# Patient Record
Sex: Female | Born: 1935 | Hispanic: No | Marital: Married | Smoking: Never smoker
Health system: Southern US, Community
[De-identification: ages and names within clinical notes are randomized; demographics above are authoritative.]

## PROBLEM LIST (undated history)

## (undated) DIAGNOSIS — M1990 Unspecified osteoarthritis, unspecified site: Secondary | ICD-10-CM

## (undated) DIAGNOSIS — M810 Age-related osteoporosis without current pathological fracture: Secondary | ICD-10-CM

## (undated) DIAGNOSIS — I10 Essential (primary) hypertension: Secondary | ICD-10-CM

## (undated) DIAGNOSIS — G309 Alzheimer's disease, unspecified: Secondary | ICD-10-CM

## (undated) DIAGNOSIS — C801 Malignant (primary) neoplasm, unspecified: Secondary | ICD-10-CM

## (undated) DIAGNOSIS — D649 Anemia, unspecified: Secondary | ICD-10-CM

## (undated) DIAGNOSIS — F0280 Dementia in other diseases classified elsewhere without behavioral disturbance: Secondary | ICD-10-CM

## (undated) HISTORY — DX: Malignant (primary) neoplasm, unspecified: C80.1

## (undated) HISTORY — DX: Dementia in other diseases classified elsewhere, unspecified severity, without behavioral disturbance, psychotic disturbance, mood disturbance, and anxiety: F02.80

## (undated) HISTORY — PX: PARTIAL HYSTERECTOMY: SHX80

## (undated) HISTORY — DX: Alzheimer's disease, unspecified: G30.9

## (undated) HISTORY — DX: Unspecified osteoarthritis, unspecified site: M19.90

## (undated) HISTORY — PX: DILATION AND CURETTAGE OF UTERUS: SHX78

## (undated) HISTORY — DX: Essential (primary) hypertension: I10

## (undated) HISTORY — PX: BREAST SURGERY: SHX581

## (undated) HISTORY — DX: Age-related osteoporosis without current pathological fracture: M81.0

## (undated) HISTORY — DX: Anemia, unspecified: D64.9

---

## 2004-09-30 ENCOUNTER — Ambulatory Visit: Payer: Self-pay | Admitting: Unknown Physician Specialty

## 2004-12-18 ENCOUNTER — Ambulatory Visit: Payer: Self-pay | Admitting: Urology

## 2005-10-20 ENCOUNTER — Ambulatory Visit: Payer: Self-pay | Admitting: Unknown Physician Specialty

## 2006-10-24 ENCOUNTER — Ambulatory Visit: Payer: Self-pay | Admitting: Unknown Physician Specialty

## 2007-05-09 ENCOUNTER — Ambulatory Visit: Payer: Self-pay | Admitting: Family Medicine

## 2007-10-25 ENCOUNTER — Encounter: Payer: Self-pay | Admitting: Unknown Physician Specialty

## 2007-11-13 ENCOUNTER — Ambulatory Visit: Payer: Self-pay | Admitting: Unknown Physician Specialty

## 2007-11-19 ENCOUNTER — Encounter: Payer: Self-pay | Admitting: Unknown Physician Specialty

## 2008-11-15 ENCOUNTER — Ambulatory Visit: Payer: Self-pay | Admitting: Unknown Physician Specialty

## 2009-11-27 ENCOUNTER — Ambulatory Visit: Payer: Self-pay | Admitting: Unknown Physician Specialty

## 2010-07-21 ENCOUNTER — Ambulatory Visit: Payer: Self-pay | Admitting: Ophthalmology

## 2010-08-05 ENCOUNTER — Ambulatory Visit: Payer: Self-pay | Admitting: Ophthalmology

## 2010-09-08 ENCOUNTER — Ambulatory Visit: Payer: Self-pay | Admitting: Ophthalmology

## 2010-12-08 ENCOUNTER — Ambulatory Visit: Payer: Self-pay | Admitting: Family Medicine

## 2011-02-11 ENCOUNTER — Emergency Department: Payer: Self-pay | Admitting: Emergency Medicine

## 2012-02-24 ENCOUNTER — Emergency Department: Payer: Self-pay | Admitting: Emergency Medicine

## 2012-02-24 LAB — CBC
HCT: 40.1 % (ref 35.0–47.0)
MCHC: 33.8 g/dL (ref 32.0–36.0)
MCV: 94 fL (ref 80–100)
Platelet: 165 10*3/uL (ref 150–440)
RBC: 4.26 10*6/uL (ref 3.80–5.20)
RDW: 13.3 % (ref 11.5–14.5)

## 2012-02-24 LAB — COMPREHENSIVE METABOLIC PANEL
Alkaline Phosphatase: 58 U/L (ref 50–136)
Bilirubin,Total: 0.5 mg/dL (ref 0.2–1.0)
Calcium, Total: 8.4 mg/dL — ABNORMAL LOW (ref 8.5–10.1)
EGFR (Non-African Amer.): 45 — ABNORMAL LOW
Osmolality: 294 (ref 275–301)
Potassium: 3.2 mmol/L — ABNORMAL LOW (ref 3.5–5.1)
SGOT(AST): 33 U/L (ref 15–37)
SGPT (ALT): 45 U/L
Total Protein: 6.7 g/dL (ref 6.4–8.2)

## 2012-02-24 LAB — DRUG SCREEN, URINE
Amphetamines, Ur Screen: NEGATIVE (ref ?–1000)
Barbiturates, Ur Screen: NEGATIVE (ref ?–200)
MDMA (Ecstasy)Ur Screen: POSITIVE (ref ?–500)
Opiate, Ur Screen: NEGATIVE (ref ?–300)
Phencyclidine (PCP) Ur S: NEGATIVE (ref ?–25)
Tricyclic, Ur Screen: NEGATIVE (ref ?–1000)

## 2012-02-24 LAB — TROPONIN I: Troponin-I: <0.02 ng/mL

## 2012-02-24 LAB — URINALYSIS, COMPLETE
Bacteria: NONE SEEN
Bilirubin,UR: NEGATIVE
Leukocyte Esterase: NEGATIVE
Protein: NEGATIVE
RBC,UR: 1 /HPF (ref 0–5)

## 2012-02-24 LAB — CK TOTAL AND CKMB (NOT AT ARMC): CK, Total: 41 U/L (ref 21–215)

## 2012-02-24 LAB — AMMONIA: Ammonia, Plasma: 35 mcmol/L — ABNORMAL HIGH (ref 11–32)

## 2012-04-07 ENCOUNTER — Ambulatory Visit: Payer: Self-pay | Admitting: Family Medicine

## 2012-04-27 IMAGING — CR DG CHEST 2V
1 series · 2 of 2 positions shown · non-contrast
Comparison: none

REASON FOR EXAM: trauma, pain on R
COMMENTS:

[Series 1: view not recorded · 0.17mm/px · 2 of 2 slices shown]
[im 1/2]
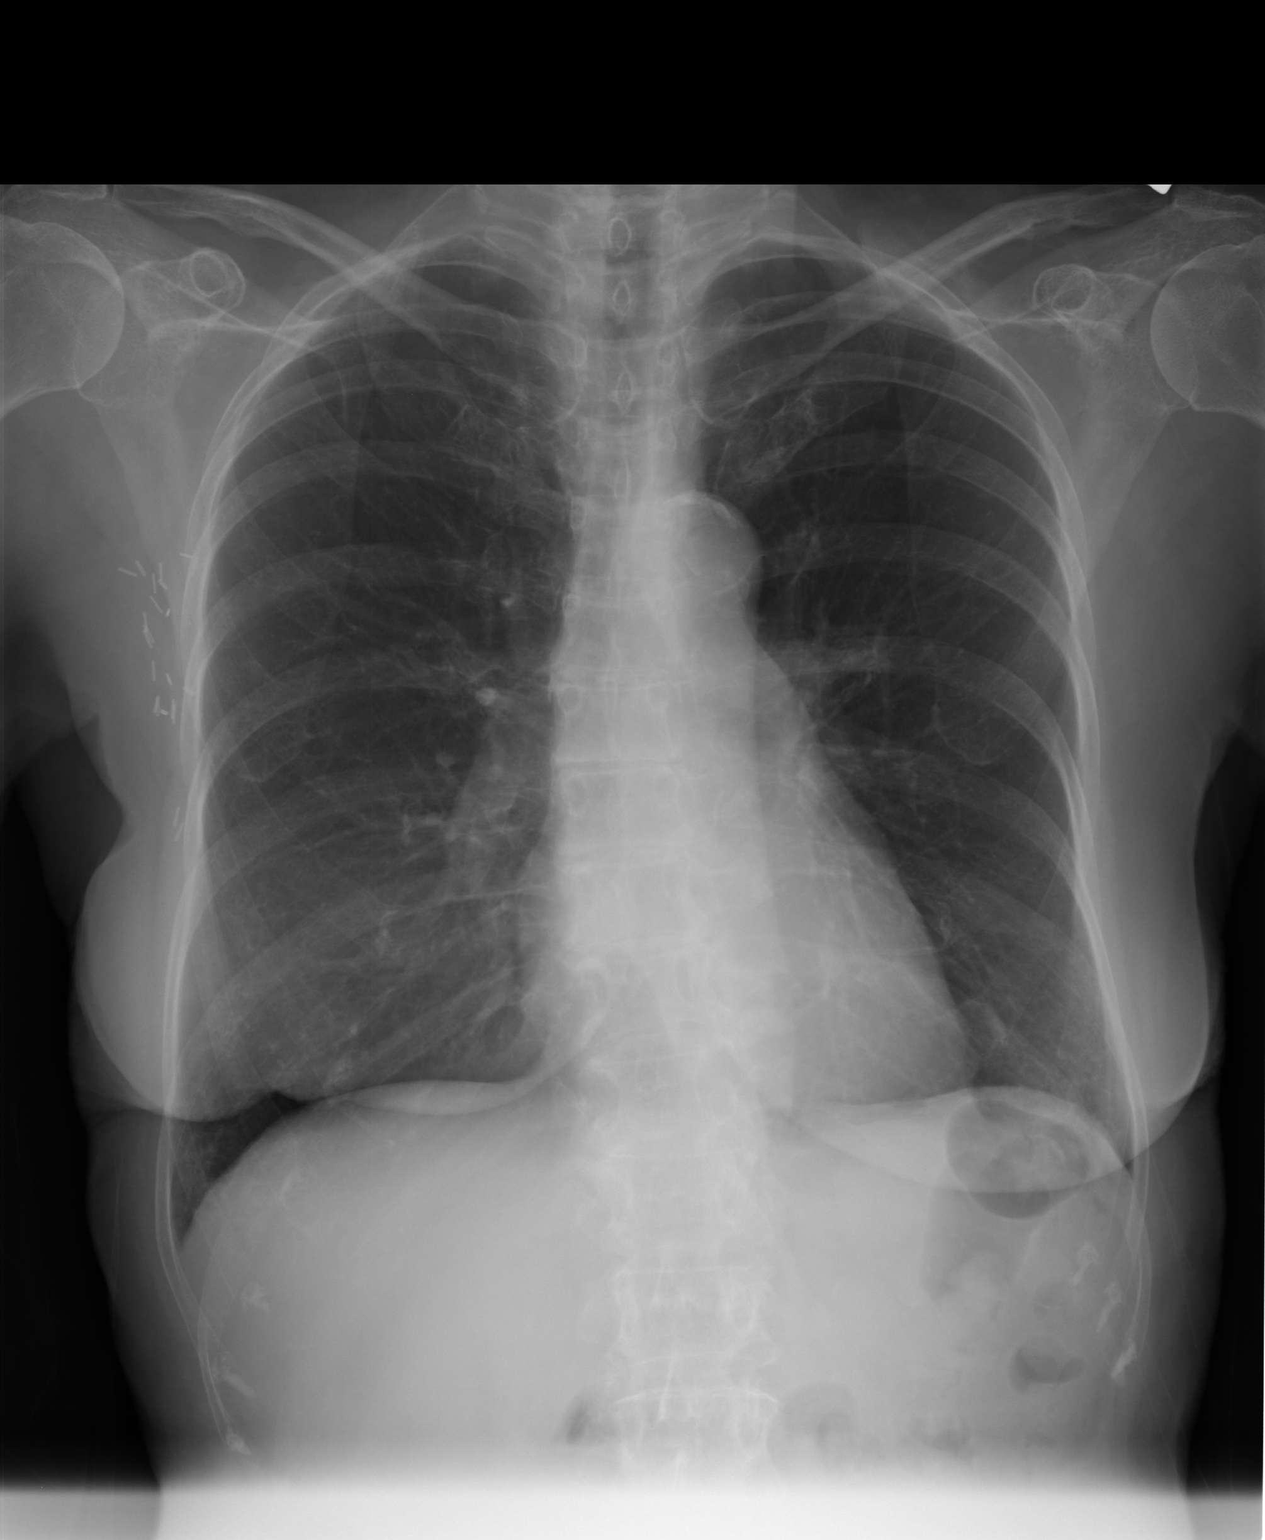
[im 2/2]
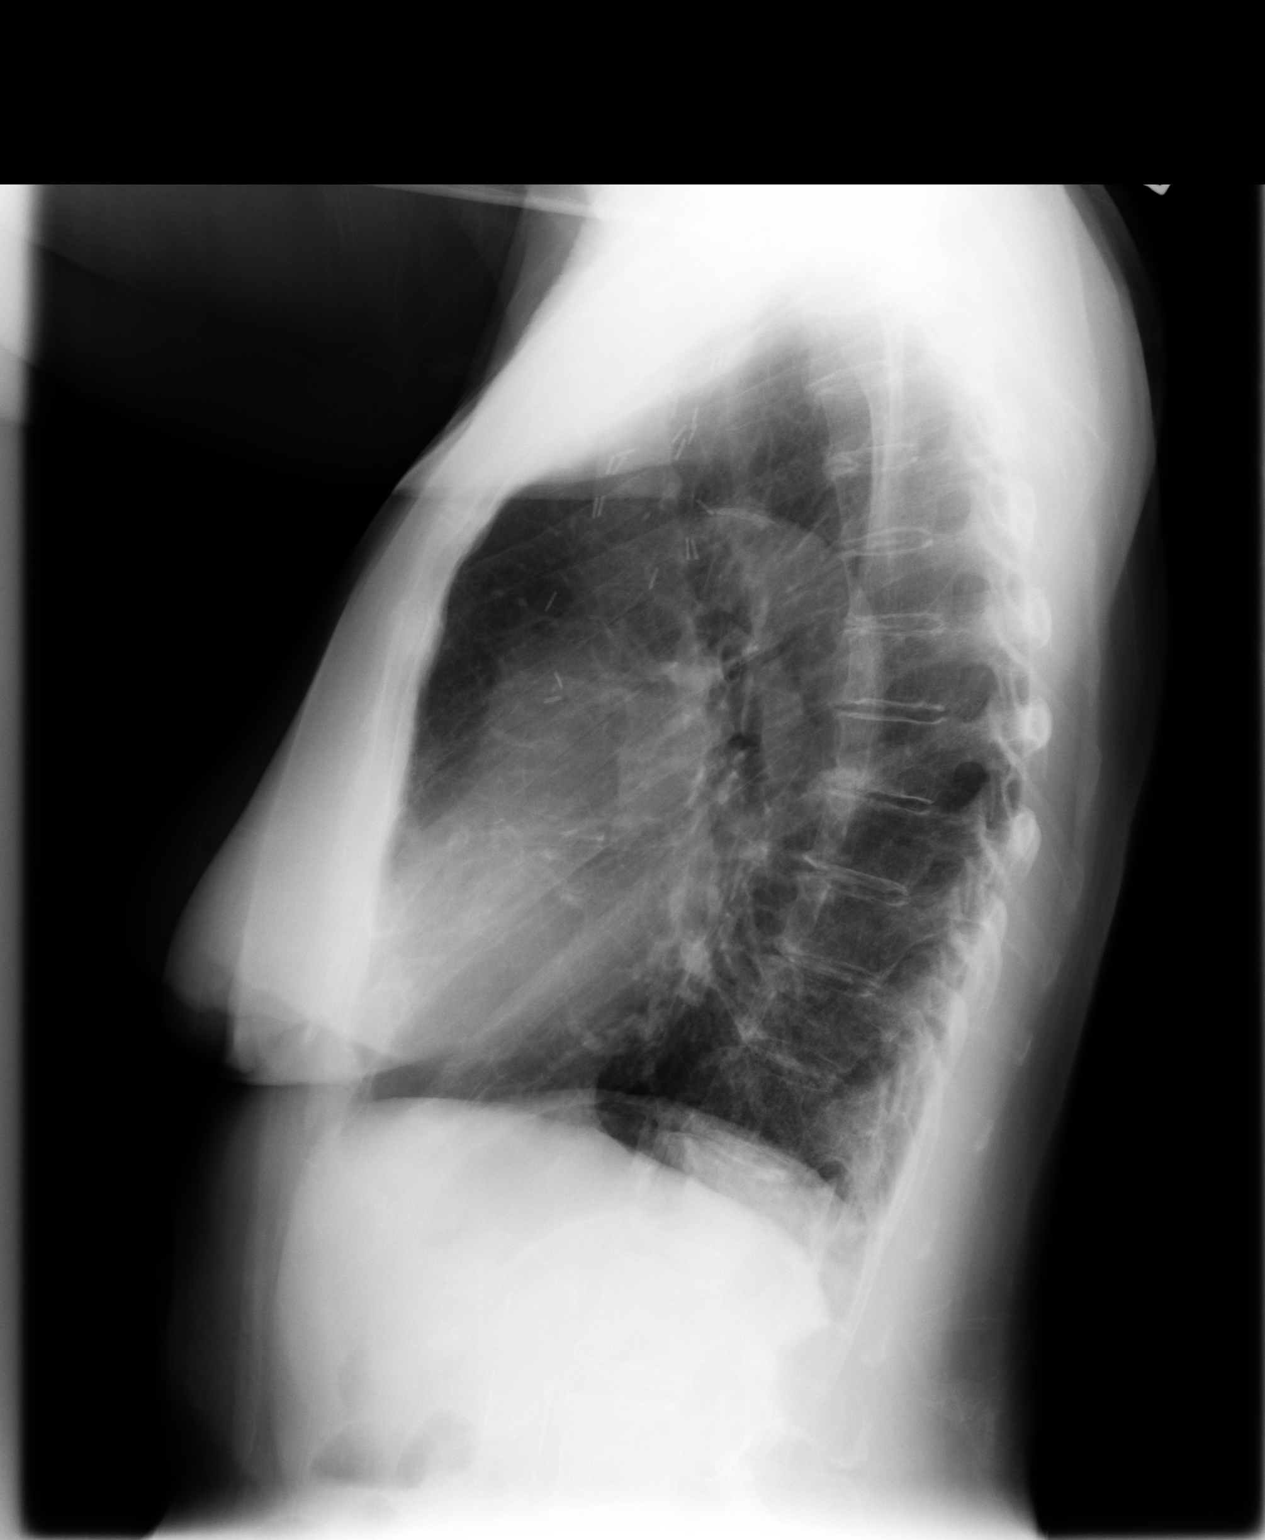

[2 of 2 positions shown; findings below may reference images not displayed]

PROCEDURE:     DXR - DXR CHEST PA (OR AP) AND LATERAL  - February 11, 2011  [DATE]

RESULT:     There is no previous exam for comparison.

Surgical clips are seen in the right axilla. The cardiac silhouette is
normal. Atherosclerotic calcification is seen in the aorta. The lungs show
emphysematous changes. There is no evidence of mass, edema, infiltrate,
effusion or pneumothorax.
IMPRESSION: 1. COPD.
2. Postoperative changes with surgical clips in the right axillary region.
3. Atherosclerotic changes present.

## 2013-05-10 IMAGING — CR DG CHEST 1V PORT
1 series · 1 of 1 positions shown · non-contrast
Comparison: none

REASON FOR EXAM: aloc
COMMENTS:

[portable]
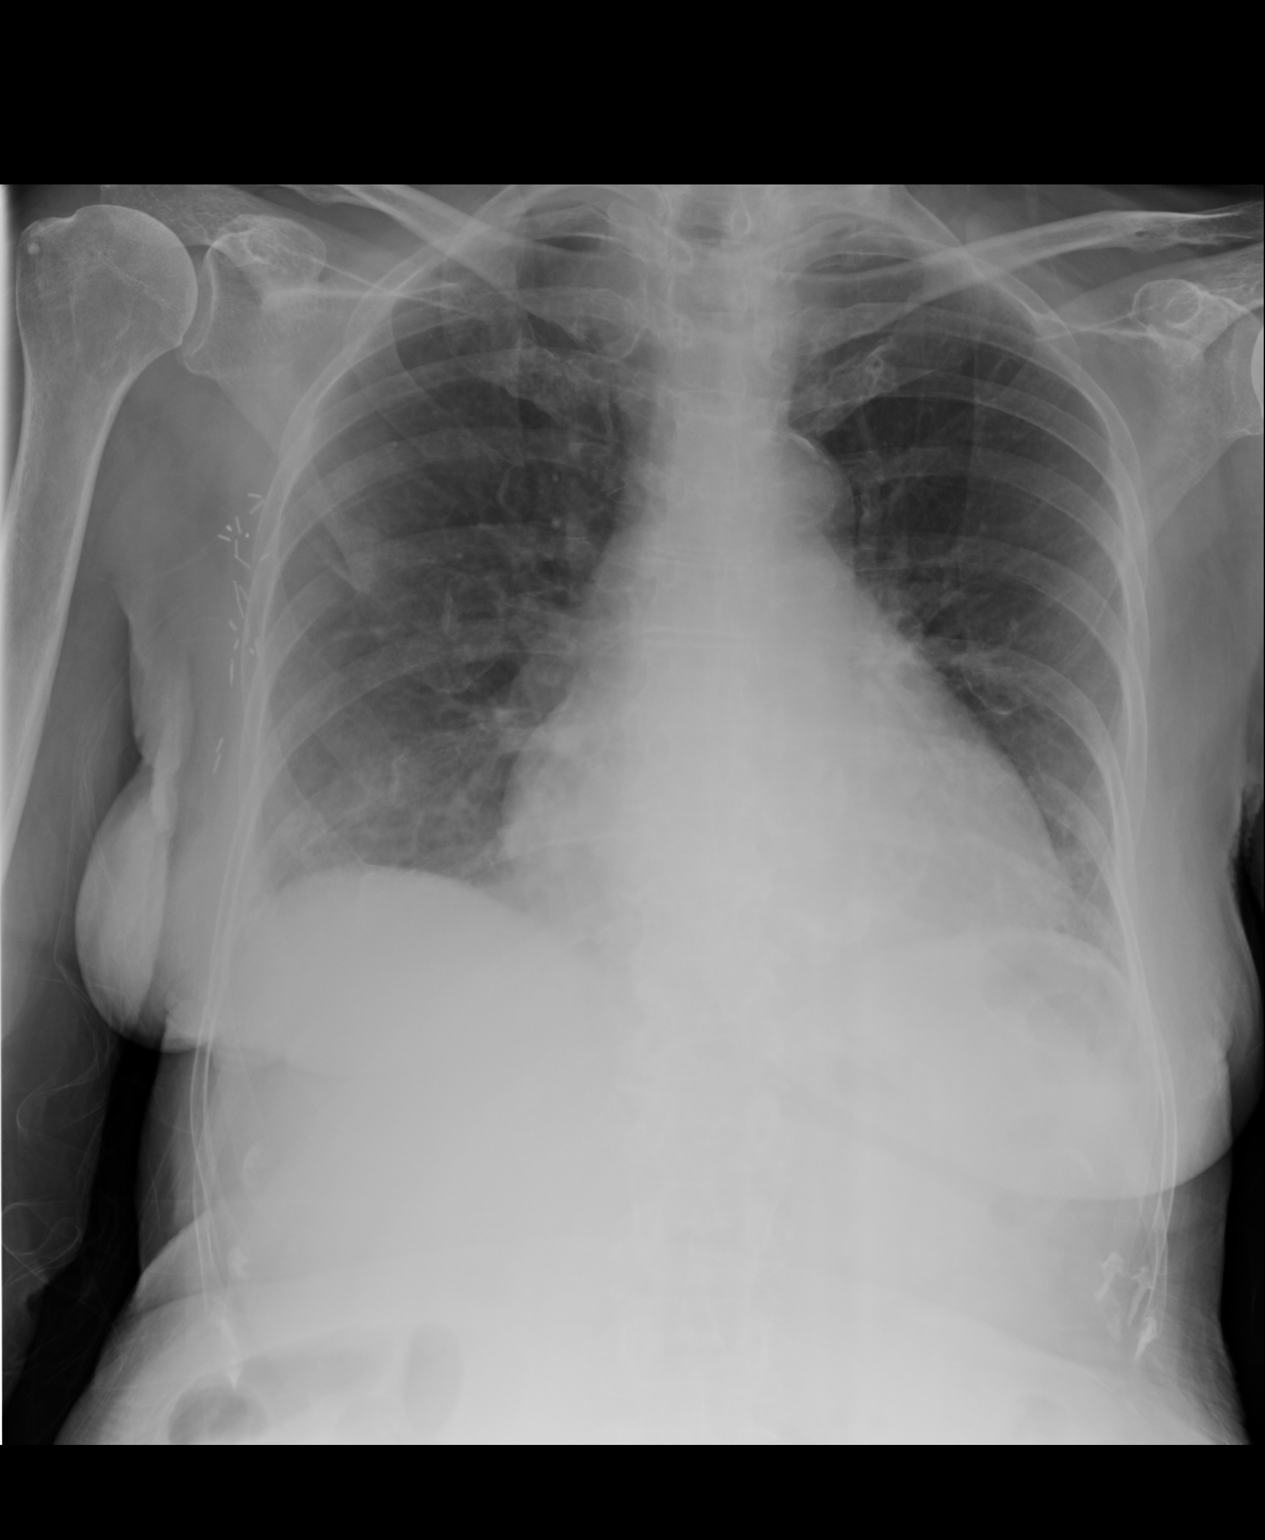

[1 of 1 positions shown; findings below may reference images not displayed]

PROCEDURE:     DXR - DXR PORTABLE CHEST SINGLE VIEW  - February 24, 2012  [DATE]

RESULT:     Frontal view of the chest is performed. Comparison is made to a
prior study dated 02/11/2012.

The patient has taken a shallow inspiration. With technique taken into
consideration, there is no evidence of focal infiltrates, effusions or
edema. This is a portable chest radiograph which also degrades evaluation.
IMPRESSION: No evidence of acute cardiopulmonary disease with technique
taken into consideration.

## 2013-06-22 IMAGING — US US EXTREM LOW VENOUS*R*
1 series · 14 of 24 positions shown · non-contrast
Comparison: none

REASON FOR EXAM: rt lower extr swelling calf pain  eval DVT    CR  [phone]
[phone]  and  [phone]
COMMENTS:

[Series 1: us extrem low venous*right* · 0.08mm/px · 14 of 39 slices shown]
[im 1/39]
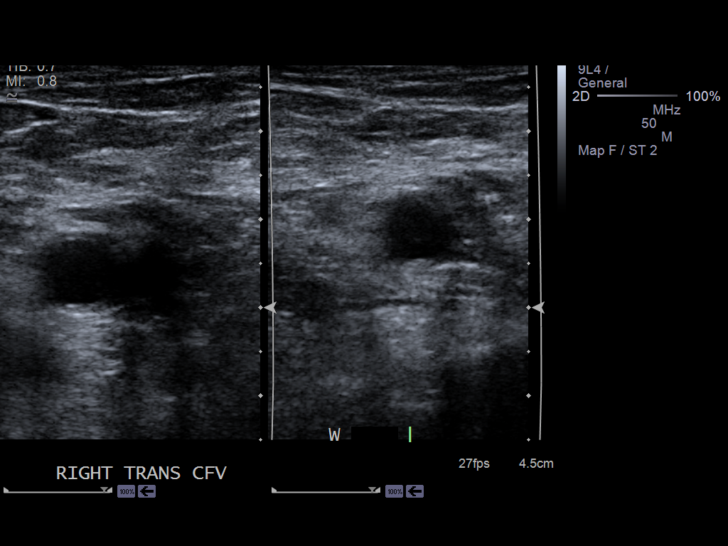
[im 4/39]
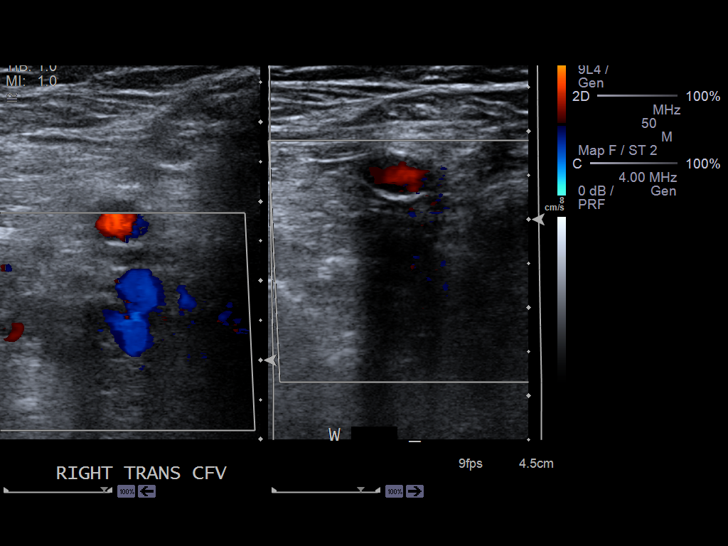
[im 7/39]
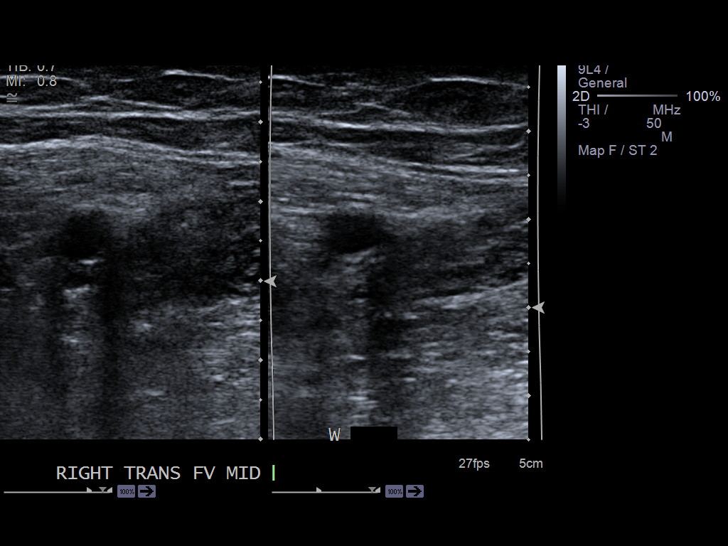
[im 10/39]
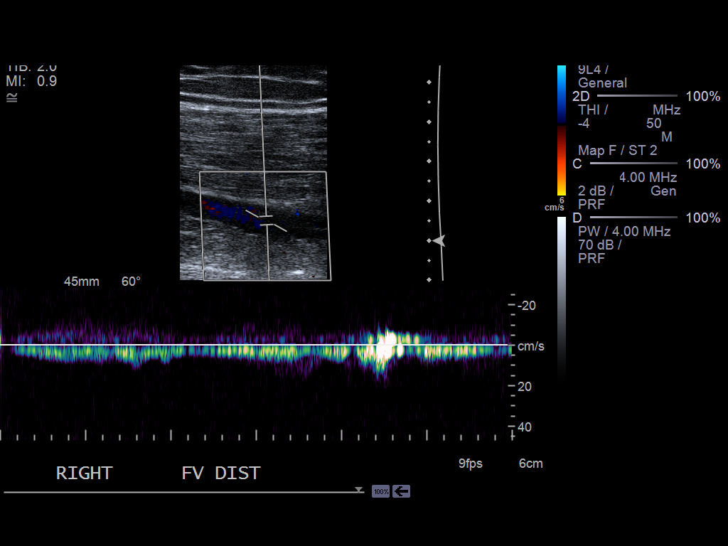
[im 12/39]
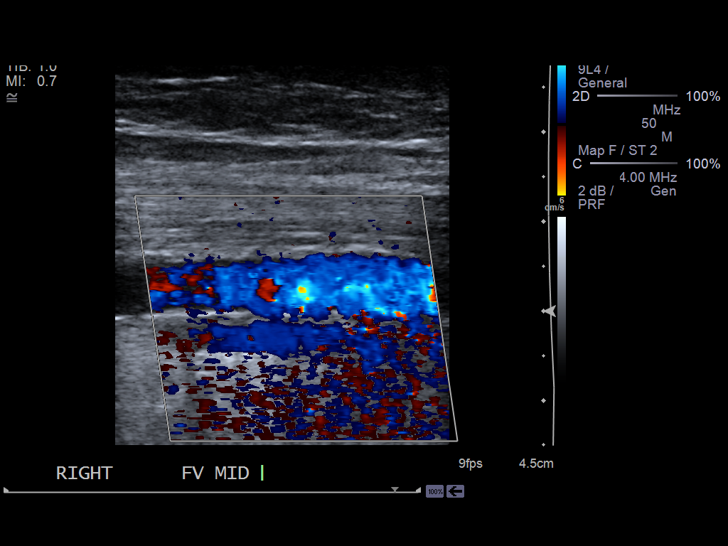
[im 15/39]
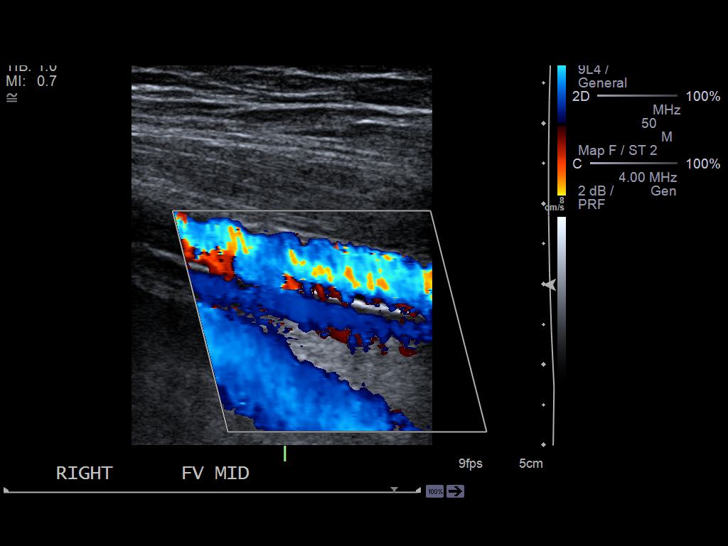
[im 19/39]
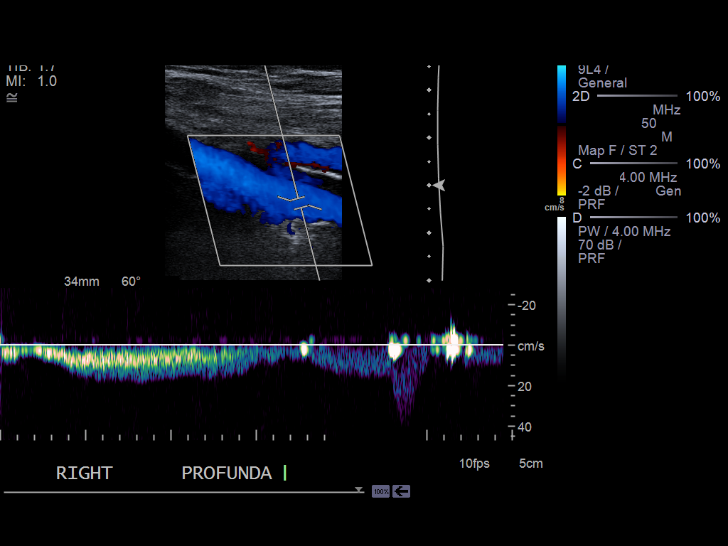
[im 20/39]
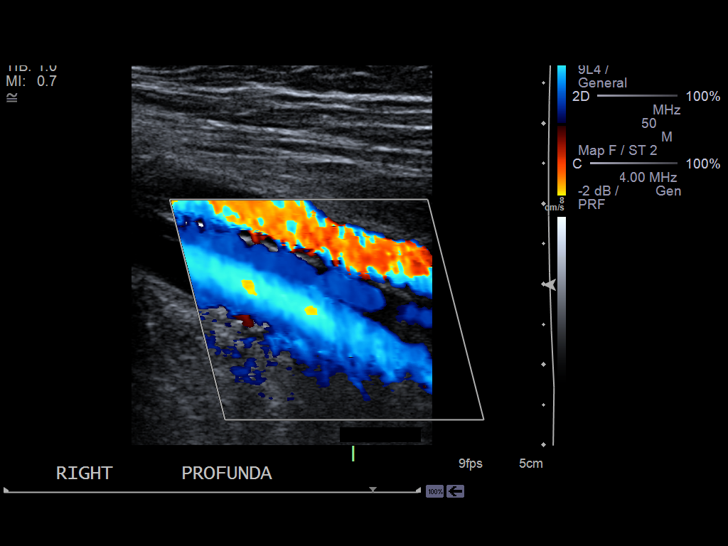
[im 24/39]
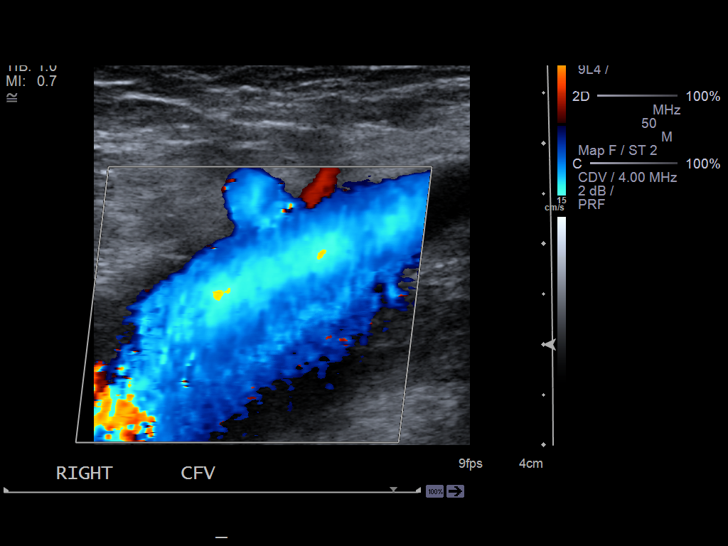
[im 27/39]
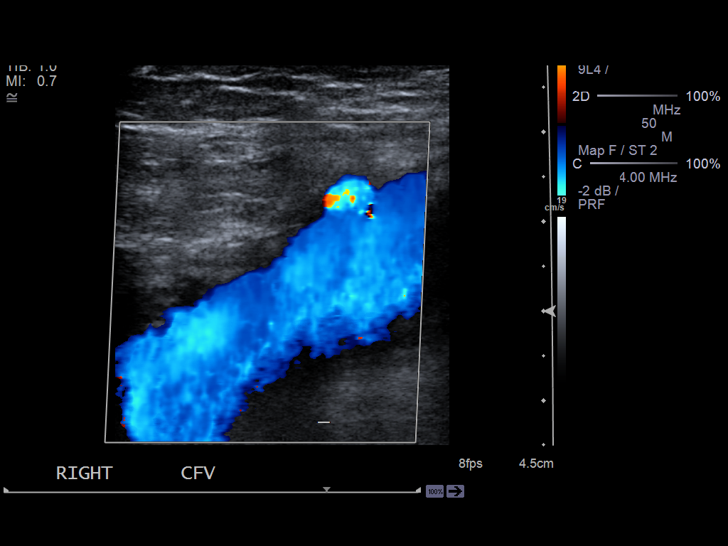
[im 30/39]
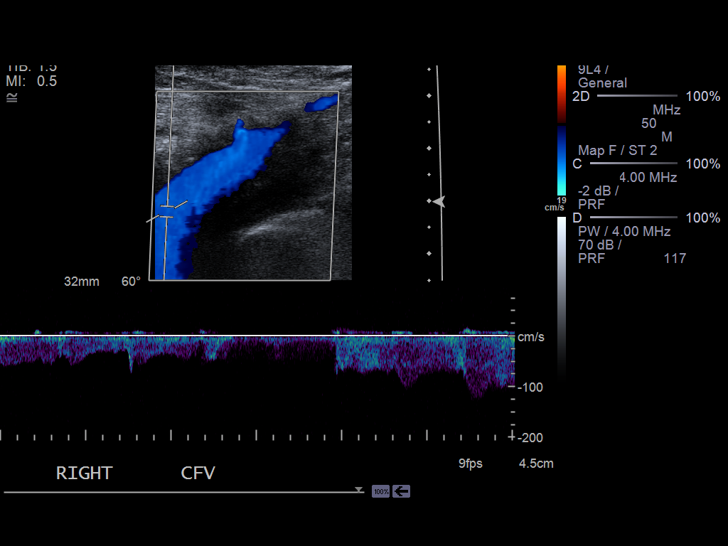
[im 32/39]
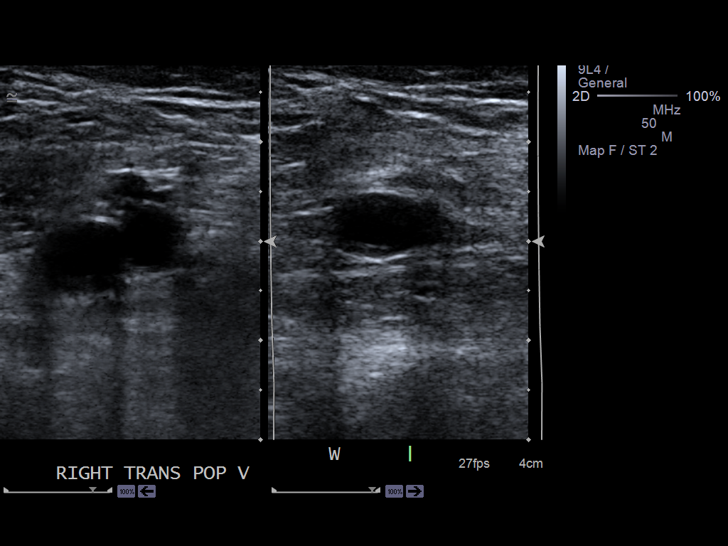
[im 35/39]
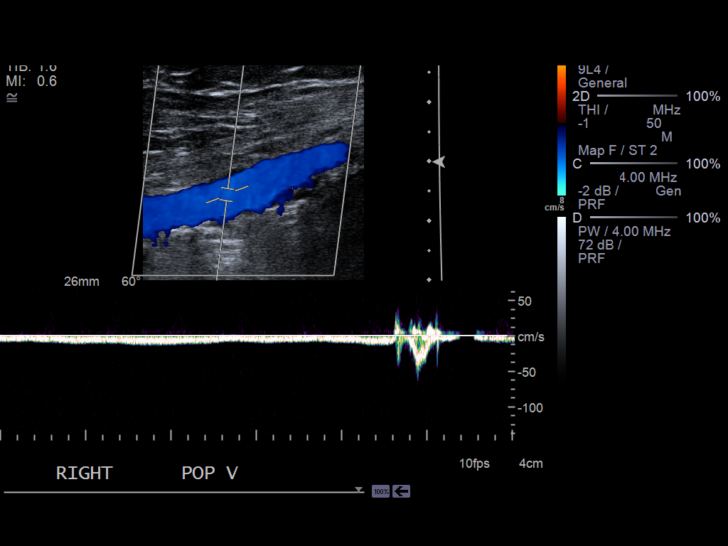
[im 39/39]
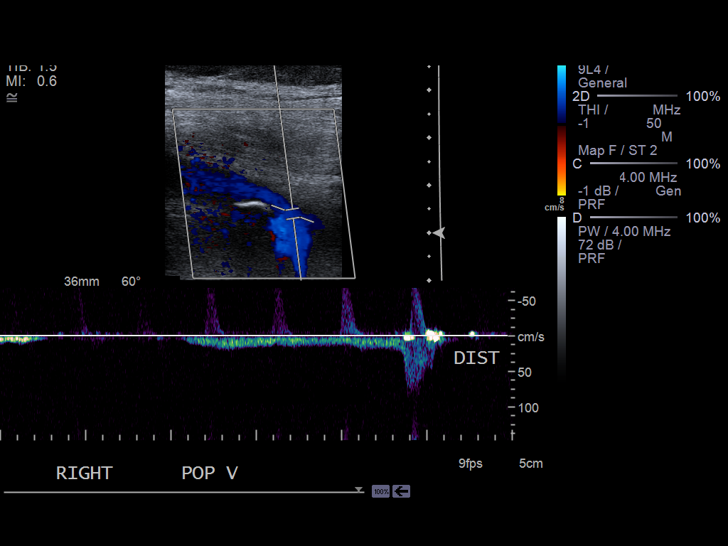

[14 of 24 positions shown; findings below may reference images not displayed]

PROCEDURE:     US  - US DOPPLER LOW EXTR RIGHT  - April 07, 2012  [DATE]

RESULT:     The phasic, augmentation and Valsalva flow waveforms are normal
in appearance. The right femoral and popliteal vein shows complete
compressibility throughout its course. Doppler examination shows no
occlusion or evidence for deep vein thrombosis.
IMPRESSION: 1. No deep vein thrombosis is identified in the right leg.

[REDACTED]

## 2013-08-10 ENCOUNTER — Ambulatory Visit: Payer: Self-pay | Admitting: Podiatry

## 2013-08-21 ENCOUNTER — Encounter: Payer: Self-pay | Admitting: Podiatry

## 2013-08-21 ENCOUNTER — Ambulatory Visit (INDEPENDENT_AMBULATORY_CARE_PROVIDER_SITE_OTHER): Payer: Medicare Other | Admitting: Podiatry

## 2013-08-21 VITALS — BP 129/76 | HR 70 | Resp 20 | Ht 65.0 in | Wt 114.0 lb

## 2013-08-21 DIAGNOSIS — B351 Tinea unguium: Secondary | ICD-10-CM

## 2013-08-21 DIAGNOSIS — M79609 Pain in unspecified limb: Secondary | ICD-10-CM

## 2013-08-21 NOTE — Progress Notes (Signed)
Subjective:     Patient ID: Kelli Little, female   DOB: 1921/05/30, 79 y.o.   MRN: [id number]  HPI nail disease with pain 1-5 both feet   Review of Systems     Objective:   Physical Exam  Nursing note and vitals reviewed. Constitutional: She is oriented to person, place, and time.  Neurological: She is oriented to person, place, and time.  Skin: Skin is dry.   Thick painful nailbeds 1-5 both feet when pressed    Assessment:     Mycotic nail infection 1-5 both feet with pain    Plan:     Debrided nails 1-5 both feet with no iatrogenic bleeding noted

## 2013-11-20 ENCOUNTER — Ambulatory Visit (INDEPENDENT_AMBULATORY_CARE_PROVIDER_SITE_OTHER): Payer: Medicare Other | Admitting: Podiatry

## 2013-11-20 ENCOUNTER — Encounter: Payer: Self-pay | Admitting: Podiatry

## 2013-11-20 VITALS — BP 134/79 | HR 66 | Resp 16 | Ht 62.0 in

## 2013-11-20 DIAGNOSIS — B351 Tinea unguium: Secondary | ICD-10-CM

## 2013-11-20 DIAGNOSIS — M79609 Pain in unspecified limb: Secondary | ICD-10-CM

## 2013-11-20 NOTE — Progress Notes (Signed)
Subjective:     Patient ID: Kelli Little, female   DOB: 20-Jan-1921, 80 y.o.   MRN: [id number]  HPI patient is found to have painful nailbeds are thick and painful 1-5 both feet   Review of Systems     Objective:   Physical Exam Neurovascular status unchanged with thick damage nailbeds 1-5 both feet    Assessment:     Chronic mycotic nail infection with pain 1-5 both feet    Plan:     Debridement painful nail bed 1-5 both feet with no iatrogenic bleeding noted

## 2014-02-19 ENCOUNTER — Ambulatory Visit (INDEPENDENT_AMBULATORY_CARE_PROVIDER_SITE_OTHER): Payer: Medicare Other | Admitting: Podiatry

## 2014-02-19 VITALS — BP 112/66 | HR 69 | Resp 16

## 2014-02-19 DIAGNOSIS — M999 Biomechanical lesion, unspecified: Secondary | ICD-10-CM

## 2014-02-19 DIAGNOSIS — M79609 Pain in unspecified limb: Secondary | ICD-10-CM

## 2014-02-19 DIAGNOSIS — B351 Tinea unguium: Secondary | ICD-10-CM

## 2014-02-19 NOTE — Progress Notes (Signed)
Subjective:     Patient ID: Kelli Little, female   DOB: 1921-02-21, 80 y.o.   MRN: [id number]  HPI patient presents with thick nailbeds 1-5 both feet that are painful and she cannot cut   Review of Systems     Objective:   Physical Exam Neurovascular status unchanged with thick painful nailbeds 1-5 both feet that are impossible for her to cut and brittle    Assessment:     Mycotic nail infection with pain 1-5 both feet    Plan:     Debridement of painful nailbeds 1-5 both feet with no iatrogenic bleeding noted

## 2014-03-22 DIAGNOSIS — M159 Polyosteoarthritis, unspecified: Secondary | ICD-10-CM

## 2014-03-22 DIAGNOSIS — IMO0002 Reserved for concepts with insufficient information to code with codable children: Secondary | ICD-10-CM

## 2014-03-22 DIAGNOSIS — G309 Alzheimer's disease, unspecified: Secondary | ICD-10-CM

## 2014-03-22 DIAGNOSIS — R634 Abnormal weight loss: Secondary | ICD-10-CM

## 2014-03-22 DIAGNOSIS — F0280 Dementia in other diseases classified elsewhere without behavioral disturbance: Secondary | ICD-10-CM

## 2014-03-22 DIAGNOSIS — I10 Essential (primary) hypertension: Secondary | ICD-10-CM

## 2014-05-06 DIAGNOSIS — M159 Polyosteoarthritis, unspecified: Secondary | ICD-10-CM

## 2014-05-06 DIAGNOSIS — R634 Abnormal weight loss: Secondary | ICD-10-CM

## 2014-05-06 DIAGNOSIS — IMO0002 Reserved for concepts with insufficient information to code with codable children: Secondary | ICD-10-CM

## 2014-05-06 DIAGNOSIS — F0280 Dementia in other diseases classified elsewhere without behavioral disturbance: Secondary | ICD-10-CM

## 2014-05-06 DIAGNOSIS — G309 Alzheimer's disease, unspecified: Secondary | ICD-10-CM

## 2014-06-05 ENCOUNTER — Telehealth: Payer: Self-pay

## 2014-06-05 DIAGNOSIS — IMO0002 Reserved for concepts with insufficient information to code with codable children: Secondary | ICD-10-CM

## 2014-06-05 DIAGNOSIS — F0280 Dementia in other diseases classified elsewhere without behavioral disturbance: Secondary | ICD-10-CM

## 2014-06-05 DIAGNOSIS — R634 Abnormal weight loss: Secondary | ICD-10-CM

## 2014-06-05 DIAGNOSIS — F430 Acute stress reaction: Secondary | ICD-10-CM

## 2014-06-05 DIAGNOSIS — M1710 Unilateral primary osteoarthritis, unspecified knee: Secondary | ICD-10-CM

## 2014-06-05 DIAGNOSIS — G309 Alzheimer's disease, unspecified: Secondary | ICD-10-CM

## 2014-06-05 NOTE — Telephone Encounter (Signed)
Discussed that her decline seems to be due to her disease Will go ahead with the hospice consult we had discussed before

## 2014-06-05 NOTE — Telephone Encounter (Signed)
Kelli Little pts son left v/m requesting cb at [phone] about pts present situation; pt is at [facility]. Mr Tamala Julian said has heard from nurses that pt is staying in bed all day and eating nothing; [name] said that is a decline in last few weeks; wants Dr Alla German opinion of pts condition and is there anything else to be done.

## 2014-06-11 ENCOUNTER — Ambulatory Visit: Payer: Medicare Other | Admitting: Podiatry

## 2014-06-17 DIAGNOSIS — F0280 Dementia in other diseases classified elsewhere without behavioral disturbance: Secondary | ICD-10-CM

## 2014-06-17 DIAGNOSIS — F02818 Dementia in other diseases classified elsewhere, unspecified severity, with other behavioral disturbance: Secondary | ICD-10-CM

## 2014-06-17 DIAGNOSIS — R634 Abnormal weight loss: Secondary | ICD-10-CM

## 2014-06-17 DIAGNOSIS — R627 Adult failure to thrive: Secondary | ICD-10-CM

## 2014-06-17 DIAGNOSIS — F0281 Dementia in other diseases classified elsewhere with behavioral disturbance: Secondary | ICD-10-CM

## 2014-06-17 DIAGNOSIS — G309 Alzheimer's disease, unspecified: Secondary | ICD-10-CM

## 2014-07-08 DIAGNOSIS — G309 Alzheimer's disease, unspecified: Secondary | ICD-10-CM

## 2014-07-08 DIAGNOSIS — E43 Unspecified severe protein-calorie malnutrition: Secondary | ICD-10-CM

## 2014-07-08 DIAGNOSIS — F0280 Dementia in other diseases classified elsewhere without behavioral disturbance: Secondary | ICD-10-CM

## 2014-07-08 DIAGNOSIS — IMO0002 Reserved for concepts with insufficient information to code with codable children: Secondary | ICD-10-CM

## 2014-07-08 DIAGNOSIS — M159 Polyosteoarthritis, unspecified: Secondary | ICD-10-CM

## 2014-08-23 DIAGNOSIS — M1990 Unspecified osteoarthritis, unspecified site: Secondary | ICD-10-CM

## 2014-08-23 DIAGNOSIS — E43 Unspecified severe protein-calorie malnutrition: Secondary | ICD-10-CM

## 2014-08-23 DIAGNOSIS — R451 Restlessness and agitation: Secondary | ICD-10-CM

## 2014-08-23 DIAGNOSIS — G309 Alzheimer's disease, unspecified: Secondary | ICD-10-CM

## 2014-08-27 ENCOUNTER — Ambulatory Visit: Payer: Self-pay | Admitting: Internal Medicine

## 2014-09-04 ENCOUNTER — Telehealth: Payer: Self-pay | Admitting: Family Medicine

## 2014-09-04 ENCOUNTER — Telehealth: Payer: Self-pay | Admitting: Internal Medicine

## 2014-09-04 NOTE — Telephone Encounter (Signed)
Left message for patient's son they would need to call [facility] and leave a message for Dr.Letvak there.

## 2014-09-04 NOTE — Telephone Encounter (Signed)
Pt's son is calling and he would like a document typed up stating that the pt unable to make financial decisions or to sign legal documents.  Thank you!

## 2014-09-25 DIAGNOSIS — R627 Adult failure to thrive: Secondary | ICD-10-CM

## 2014-09-25 DIAGNOSIS — G309 Alzheimer's disease, unspecified: Secondary | ICD-10-CM

## 2014-09-25 DIAGNOSIS — F0281 Dementia in other diseases classified elsewhere with behavioral disturbance: Secondary | ICD-10-CM

## 2014-09-25 DIAGNOSIS — R634 Abnormal weight loss: Secondary | ICD-10-CM

## 2014-10-31 DIAGNOSIS — F0391 Unspecified dementia with behavioral disturbance: Secondary | ICD-10-CM

## 2014-10-31 DIAGNOSIS — M1991 Primary osteoarthritis, unspecified site: Secondary | ICD-10-CM | POA: Diagnosis not present

## 2014-10-31 DIAGNOSIS — E43 Unspecified severe protein-calorie malnutrition: Secondary | ICD-10-CM | POA: Diagnosis not present

## 2014-10-31 DIAGNOSIS — R451 Restlessness and agitation: Secondary | ICD-10-CM | POA: Diagnosis not present

## 2014-12-10 DIAGNOSIS — R634 Abnormal weight loss: Secondary | ICD-10-CM

## 2014-12-10 DIAGNOSIS — G309 Alzheimer's disease, unspecified: Secondary | ICD-10-CM | POA: Diagnosis not present

## 2014-12-10 DIAGNOSIS — R627 Adult failure to thrive: Secondary | ICD-10-CM

## 2014-12-10 DIAGNOSIS — F0281 Dementia in other diseases classified elsewhere with behavioral disturbance: Secondary | ICD-10-CM | POA: Diagnosis not present

## 2015-01-01 DIAGNOSIS — E43 Unspecified severe protein-calorie malnutrition: Secondary | ICD-10-CM

## 2015-01-01 DIAGNOSIS — R451 Restlessness and agitation: Secondary | ICD-10-CM

## 2015-01-01 DIAGNOSIS — G309 Alzheimer's disease, unspecified: Secondary | ICD-10-CM

## 2015-01-01 DIAGNOSIS — M1990 Unspecified osteoarthritis, unspecified site: Secondary | ICD-10-CM | POA: Diagnosis not present
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
## 2015-02-09 NOTE — Consult Note (Signed)
Brief Consult Note: Diagnosis: dementia with behavioral disturbance.   Patient was seen by consultant.   Consult note dictated.   Recommend further assessment or treatment.   Comments: Psychiatry:Patient seen. Chart and accompning papers reviewed. Patient with history of dementia presumed Alzheimer's type has been showing problems with aggression and food refusal for months. Current behaviors and persistance of food refusal have become worrisome enough they sent her to ER. Workup so far not showing obvious cause of increased confusin. Patient denies being in pain or discomfort. I agree with current plan for transfer to gero-psych ward for specialty care beyond what is avilible at [facility].  Electronic Signatures: Gonzella Lex (MD)  (Signed 10-May-13 14:59)  Authored: Brief Consult Note   Last Updated: 10-May-13 14:59 by Gonzella Lex (MD)

## 2015-02-10 DIAGNOSIS — F0281 Dementia in other diseases classified elsewhere with behavioral disturbance: Secondary | ICD-10-CM

## 2015-02-10 DIAGNOSIS — R634 Abnormal weight loss: Secondary | ICD-10-CM

## 2015-02-10 DIAGNOSIS — G309 Alzheimer's disease, unspecified: Secondary | ICD-10-CM | POA: Diagnosis not present

## 2015-02-10 DIAGNOSIS — R627 Adult failure to thrive: Secondary | ICD-10-CM

## 2015-02-27 DIAGNOSIS — M150 Primary generalized (osteo)arthritis: Secondary | ICD-10-CM | POA: Diagnosis not present

## 2015-02-27 DIAGNOSIS — G301 Alzheimer's disease with late onset: Secondary | ICD-10-CM | POA: Diagnosis not present

## 2015-02-27 DIAGNOSIS — E43 Unspecified severe protein-calorie malnutrition: Secondary | ICD-10-CM | POA: Diagnosis not present

## 2015-02-27 DIAGNOSIS — R451 Restlessness and agitation: Secondary | ICD-10-CM

## 2015-04-09 DIAGNOSIS — R627 Adult failure to thrive: Secondary | ICD-10-CM

## 2015-04-09 DIAGNOSIS — R634 Abnormal weight loss: Secondary | ICD-10-CM | POA: Diagnosis not present

## 2015-04-09 DIAGNOSIS — G309 Alzheimer's disease, unspecified: Secondary | ICD-10-CM | POA: Diagnosis not present

## 2015-04-09 DIAGNOSIS — F0281 Dementia in other diseases classified elsewhere with behavioral disturbance: Secondary | ICD-10-CM | POA: Diagnosis not present

## 2015-04-30 DIAGNOSIS — G309 Alzheimer's disease, unspecified: Secondary | ICD-10-CM | POA: Diagnosis not present

## 2015-04-30 DIAGNOSIS — R451 Restlessness and agitation: Secondary | ICD-10-CM | POA: Diagnosis not present

## 2015-04-30 DIAGNOSIS — E43 Unspecified severe protein-calorie malnutrition: Secondary | ICD-10-CM | POA: Diagnosis not present

## 2015-04-30 DIAGNOSIS — M1990 Unspecified osteoarthritis, unspecified site: Secondary | ICD-10-CM | POA: Diagnosis not present

## 2015-05-29 DIAGNOSIS — S7292XA Unspecified fracture of left femur, initial encounter for closed fracture: Secondary | ICD-10-CM | POA: Diagnosis not present

## 2015-06-19 DIAGNOSIS — R627 Adult failure to thrive: Secondary | ICD-10-CM | POA: Diagnosis not present

## 2015-06-19 DIAGNOSIS — G309 Alzheimer's disease, unspecified: Secondary | ICD-10-CM | POA: Diagnosis not present

## 2015-06-19 DIAGNOSIS — F0281 Dementia in other diseases classified elsewhere with behavioral disturbance: Secondary | ICD-10-CM | POA: Diagnosis not present

## 2015-06-19 DIAGNOSIS — R634 Abnormal weight loss: Secondary | ICD-10-CM | POA: Diagnosis not present

## 2015-06-26 DIAGNOSIS — G309 Alzheimer's disease, unspecified: Secondary | ICD-10-CM | POA: Diagnosis not present

## 2015-06-26 DIAGNOSIS — E440 Moderate protein-calorie malnutrition: Secondary | ICD-10-CM | POA: Diagnosis not present

## 2015-06-26 DIAGNOSIS — R451 Restlessness and agitation: Secondary | ICD-10-CM | POA: Diagnosis not present

## 2015-06-26 DIAGNOSIS — S7292XA Unspecified fracture of left femur, initial encounter for closed fracture: Secondary | ICD-10-CM | POA: Diagnosis not present

## 2015-08-06 DIAGNOSIS — R451 Restlessness and agitation: Secondary | ICD-10-CM

## 2015-08-06 DIAGNOSIS — F0281 Dementia in other diseases classified elsewhere with behavioral disturbance: Secondary | ICD-10-CM

## 2015-08-06 DIAGNOSIS — E43 Unspecified severe protein-calorie malnutrition: Secondary | ICD-10-CM

## 2015-08-06 DIAGNOSIS — M1990 Unspecified osteoarthritis, unspecified site: Secondary | ICD-10-CM | POA: Diagnosis not present

## 2015-08-06 DIAGNOSIS — R634 Abnormal weight loss: Secondary | ICD-10-CM | POA: Diagnosis not present

## 2015-08-06 DIAGNOSIS — S72012A Unspecified intracapsular fracture of left femur, initial encounter for closed fracture: Secondary | ICD-10-CM

## 2015-08-06 DIAGNOSIS — G309 Alzheimer's disease, unspecified: Secondary | ICD-10-CM

## 2015-08-06 DIAGNOSIS — R627 Adult failure to thrive: Secondary | ICD-10-CM | POA: Diagnosis not present

## 2015-08-18 ENCOUNTER — Telehealth: Payer: Self-pay

## 2015-08-18 NOTE — Telephone Encounter (Signed)
Rx for roxanol done---signed today

## 2015-08-18 NOTE — Telephone Encounter (Signed)
PLEASE NOTE: All timestamps contained within this report are represented as [place] Standard Time. CONFIDENTIALTY NOTICE: This fax transmission is intended only for the addressee. It contains information that is legally privileged, confidential or otherwise protected from use or disclosure. If you are not the intended recipient, you are strictly prohibited from reviewing, disclosing, copying using or disseminating any of this information or taking any action in reliance on or regarding this information. If you have received this fax in error, please notify us immediately by telephone so that we can arrange for its return to [place]. Phone: [phone], Toll-Free: [phone], Fax: [phone] Page: 1 of 1 Call Id: [id number] Yaak Patient Name: Kelli Little Gender: Female DOB: 21-Sep-1921 Age: 81 Y 3 M 29 D Return Phone Number: Address: City/State/Zip: [place] Client [place] Night - Client Client Site [place] Physician Viviana Simpler Contact Type Call Call Type Page Only Caller Name Juliann Pulse Relationship To Patient Provider Is this call to report lab results? No Return Phone Number Please choose phone number Initial Comment Caller states she is Suanne Marker from [facility] needed a RX called in for Dr. Alla German pt [name]. CB# [phone] Nurse Assessment Guidelines Guideline Title Affirmed Question Affirmed Notes Nurse Date/Time Eilene Ghazi Time) Disp. Time Eilene Ghazi Time) Disposition Final User 08/17/2015 1:07:40 PM Send to Peterman, Hannah 08/17/2015 1:15:05 PM Paged On Call back to Call Gifford, Blain 08/17/2015 1:17:49 PM Page Completed Yes Erskin Burnet After Care Instructions Given Call Event Type User Date / Time Description Paging DoctorName Phone DateTime Result/Outcome Message  Type Notes Carolann Littler [phone] 08/17/2015 1:15:05 PM Paged On Call Back to Call Center Doctor Paged Please call Armanda Magic [facility] @ [phone]. Thank you. Carolann Littler 08/17/2015 1:17:35 PM Spoke with On Call - General Message Result

## 2015-08-19 ENCOUNTER — Telehealth: Payer: Self-pay

## 2015-09-18 NOTE — Telephone Encounter (Signed)
PLEASE NOTE: All timestamps contained within this report are represented as [place] Standard Time. CONFIDENTIALTY NOTICE: This fax transmission is intended only for the addressee. It contains information that is legally privileged, confidential or otherwise protected from use or disclosure. If you are not the intended recipient, you are strictly prohibited from reviewing, disclosing, copying using or disseminating any of this information or taking any action in reliance on or regarding this information. If you have received this fax in error, please notify us immediately by telephone so that we can arrange for its return to [place]. Phone: [phone], Toll-Free: [phone], Fax: [phone] Page: 1 of 1 Call Id: [id number] [place] Patient Name: Kelli Little Gender: Female DOB: 03-Nov-1920 Age: 82 Y 4 M Return Phone Number: Address: City/State/Zip: [place] Client [place] Night - Client Client Site [place] Physician Viviana Simpler Contact Type Call Call Type Page Only Caller Name Meredith Mody Relationship To Patient Provider Is this call to report lab results? No Return Phone Number Please choose phone number Initial Comment [place] from [employer] at [facility] [phone] ,Pt has passed Nurse Assessment Guidelines Guideline Title Affirmed Question Affirmed Notes Nurse Date/Time (Eastern Time) Disp. Time Eilene Ghazi Time) Disposition Final User 09/01/15 4:21:24 AM Send to [facility] Paging Queue Almon Register 09-01-2015 4:24:05 AM Paged On Call to Other Provider Rona Ravens 09/01/15 4:24:21 AM Page Completed Yes Rona Ravens After Care Instructions Given Call Event Type User Date / Time Description Paging [facility] Phone DateTime Result/Outcome Message Type Notes Kathlene November [phone] 09/01/2015 4:24:05 AM Paged On  Call to Other Provider Doctor Paged Please call Webb Silversmith at [phone] regarding [name]. Kathlene November 09/01/15 4:24:09 AM Paged On Call to Another Provider Message Result

## 2015-09-18 NOTE — Telephone Encounter (Signed)
This was expected 

## 2015-09-18 DEATH — deceased
